# Patient Record
Sex: Male | Born: 1995 | Race: White | Hispanic: No | Marital: Single | Smoking: Never smoker
Health system: Southern US, Community
[De-identification: ages and names within clinical notes are randomized; demographics above are authoritative.]

## PROBLEM LIST (undated history)

## (undated) DIAGNOSIS — N200 Calculus of kidney: Secondary | ICD-10-CM

---
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
## 2014-02-13 HISTORY — PX: WISDOM TOOTH EXTRACTION: SHX21

## 2015-02-13 ENCOUNTER — Ambulatory Visit (INDEPENDENT_AMBULATORY_CARE_PROVIDER_SITE_OTHER): Payer: 59 | Admitting: Urgent Care

## 2015-02-13 VITALS — BP 136/84 | HR 115 | Temp 98.2°F | Resp 17 | Ht 71.5 in | Wt 202.0 lb

## 2015-02-13 DIAGNOSIS — R591 Generalized enlarged lymph nodes: Secondary | ICD-10-CM | POA: Diagnosis not present

## 2015-02-13 DIAGNOSIS — R070 Pain in throat: Secondary | ICD-10-CM | POA: Diagnosis not present

## 2015-02-13 DIAGNOSIS — J039 Acute tonsillitis, unspecified: Secondary | ICD-10-CM

## 2015-02-13 MED ORDER — AMOXICILLIN-POT CLAVULANATE 875-125 MG PO TABS: 1.0000 | ORAL_TABLET | Freq: Two times a day (BID) | ORAL | Status: DC

## 2015-02-13 MED ORDER — HYDROCODONE-HOMATROPINE 5-1.5 MG/5ML PO SYRP: 5.0000 mL | ORAL_SOLUTION | Freq: Every evening | ORAL | Status: DC | PRN

## 2015-02-13 NOTE — Progress Notes (Signed)
    MRN: [id number] DOB: 10/22/1995  Subjective:   Jared Page is a 19 y.o. male with pmh of Diabetes I presenting for chief complaint of Sore Throat and Flu Vaccine  Reports 1 week history of worsening sore throat of left side, difficulty swallowing and is unable to hydrate and eat well in the past 2 days. Has also had left sided neck pain, slight cough, sinus congestion, left ear pain. Also feels fatigued, worn down. Went to [facility] and was negative for strep on 12/23, 30/2016. Has tried numbing salt water gargles with minimal relief. He did have a day or two were he had improved but suddenly felt significant throat pain. Denies fever, chest pain, shob, wheezing, n/v, abdominal pain, sinus pain, ear drainage, eye pain, red eyes, tooth pain. Has had a history of 1-2 strep throat infections. Has also had mono.  Jared Page has a current medication list which includes the following prescription(s): insulin lispro. Also has No Known Allergies.  Jared Page  has a past medical history of Diabetes mellitus. Also  has no past surgical history on file.  Objective:   Vitals: BP 136/84 mmHg  Pulse 115  Temp(Src) 98.2 F (36.8 C) (Oral)  Resp 17  Ht 5' 11.5" (1.816 m)  Wt 202 lb (91.627 kg)  BMI 27.78 kg/m2  SpO2 98%  Physical Exam  Constitutional: He is oriented to person, place, and time. He appears well-developed and well-nourished.  HENT:  Head:    TM's intact bilaterally, no effusions or erythema. Nasal turbinates pink and moist. No sinus tenderness. Bilateral tonsillar erythema and edema but without exudates.  Eyes: Right eye exhibits no discharge. Left eye exhibits no discharge. No scleral icterus.  Neck: Normal range of motion. Neck supple.  Cardiovascular: Normal rate, regular rhythm and intact distal pulses.  Exam reveals no gallop and no friction rub.   No murmur heard. Pulmonary/Chest: No respiratory distress. He has no wheezes. He has no rales.  Lymphadenopathy:    He has cervical  adenopathy (left, anterior).  Neurological: He is alert and oriented to person, place, and time.  Skin: Skin is warm and dry. No rash noted. No erythema. No pallor.   Assessment and Plan :   1. Acute tonsillitis, unspecified etiology 2. Throat pain - Despite negative strep tests in other clinics, I will cover for infectious process. Counseled patient on differential including Mono and morbilliform rash. Hycodan for throat pain  3. Lymphadenopathy - Monitor, if no improvement consider US of left cervical lymph node.  Wallis BambergMario Lakaisha Danish, PA-C [facility] [phone] 02/13/2015 12:51 PM

## 2015-02-13 NOTE — Patient Instructions (Signed)

## 2015-02-15 ENCOUNTER — Emergency Department (HOSPITAL_COMMUNITY)
Admission: EM | Admit: 2015-02-15 | Discharge: 2015-02-15 | Disposition: A | Discharge status: Home / Self Care | Payer: 59 | Attending: Emergency Medicine | Admitting: Emergency Medicine

## 2015-02-15 ENCOUNTER — Encounter (HOSPITAL_COMMUNITY): Payer: Self-pay | Admitting: Emergency Medicine

## 2015-02-15 ENCOUNTER — Encounter: Payer: Self-pay | Admitting: Family Medicine

## 2015-02-15 ENCOUNTER — Ambulatory Visit (INDEPENDENT_AMBULATORY_CARE_PROVIDER_SITE_OTHER): Payer: 59 | Admitting: Family Medicine

## 2015-02-15 ENCOUNTER — Emergency Department (HOSPITAL_COMMUNITY): Payer: 59

## 2015-02-15 VITALS — BP 140/80 | HR 146 | Temp 98.6°F | Resp 16 | Ht 71.5 in | Wt 197.8 lb

## 2015-02-15 DIAGNOSIS — J039 Acute tonsillitis, unspecified: Secondary | ICD-10-CM

## 2015-02-15 DIAGNOSIS — E869 Volume depletion, unspecified: Secondary | ICD-10-CM | POA: Diagnosis not present

## 2015-02-15 DIAGNOSIS — R110 Nausea: Secondary | ICD-10-CM | POA: Insufficient documentation

## 2015-02-15 DIAGNOSIS — D72829 Elevated white blood cell count, unspecified: Secondary | ICD-10-CM

## 2015-02-15 DIAGNOSIS — R000 Tachycardia, unspecified: Secondary | ICD-10-CM | POA: Insufficient documentation

## 2015-02-15 DIAGNOSIS — E109 Type 1 diabetes mellitus without complications: Secondary | ICD-10-CM

## 2015-02-15 DIAGNOSIS — J36 Peritonsillar abscess: Secondary | ICD-10-CM | POA: Diagnosis not present

## 2015-02-15 DIAGNOSIS — E119 Type 2 diabetes mellitus without complications: Secondary | ICD-10-CM | POA: Insufficient documentation

## 2015-02-15 DIAGNOSIS — Z794 Long term (current) use of insulin: Secondary | ICD-10-CM | POA: Diagnosis not present

## 2015-02-15 DIAGNOSIS — J029 Acute pharyngitis, unspecified: Secondary | ICD-10-CM | POA: Diagnosis present

## 2015-02-15 DIAGNOSIS — R1310 Dysphagia, unspecified: Secondary | ICD-10-CM

## 2015-02-15 LAB — I-STAT CHEM 8, ED
BUN: 21 mg/dL — ABNORMAL HIGH (ref 6–20)
CHLORIDE: 103 mmol/L (ref 101–111)
CREATININE: 0.7 mg/dL (ref 0.61–1.24)
Calcium, Ion: 1.14 mmol/L (ref 1.12–1.23)
GLUCOSE: 70 mg/dL (ref 65–99)
HEMATOCRIT: 59 % — AB (ref 39.0–52.0)
Hemoglobin: 20.1 g/dL — ABNORMAL HIGH (ref 13.0–17.0)
POTASSIUM: 4.1 mmol/L (ref 3.5–5.1)
Sodium: 140 mmol/L (ref 135–145)
TCO2: 25 mmol/L (ref 0–100)

## 2015-02-15 LAB — POCT CBC
Granulocyte percent: 85.4 %G — AB (ref 37–80)
HEMATOCRIT: 47.9 % (ref 43.5–53.7)
Hemoglobin: 17 g/dL (ref 14.1–18.1)
LYMPH, POC: 1.9 (ref 0.6–3.4)
MCH, POC: 30.5 pg (ref 27–31.2)
MCHC: 35.4 g/dL (ref 31.8–35.4)
MCV: 86.3 fL (ref 80–97)
MID (cbc): 0.8 (ref 0–0.9)
MPV: 7.5 fL (ref 0–99.8)
POC GRANULOCYTE: 16.1 — AB (ref 2–6.9)
POC LYMPH %: 10.2 % (ref 10–50)
POC MID %: 4.4 % (ref 0–12)
Platelet Count, POC: 308 10*3/uL (ref 142–424)
RBC: 5.56 M/uL (ref 4.69–6.13)
RDW, POC: 12.5 %
WBC: 18.9 10*3/uL — AB (ref 4.6–10.2)

## 2015-02-15 LAB — POCT RAPID STREP A (OFFICE): RAPID STREP A SCREEN: NEGATIVE

## 2015-02-15 LAB — MONONUCLEOSIS SCREEN: MONO SCREEN: NEGATIVE

## 2015-02-15 LAB — GLUCOSE, POCT (MANUAL RESULT ENTRY): POC Glucose: 94 mg/dl (ref 70–99)

## 2015-02-15 MED ORDER — SODIUM CHLORIDE 0.9 % IV SOLN
1.0000 g | Freq: Once | INTRAVENOUS | Status: AC
  Administered 2015-02-15: 1 g via INTRAVENOUS
  Filled 2015-02-15: qty 1000

## 2015-02-15 MED ORDER — DEXAMETHASONE SODIUM PHOSPHATE 10 MG/ML IJ SOLN
10.0000 mg | Freq: Once | INTRAMUSCULAR | Status: AC
  Administered 2015-02-15: 10 mg via INTRAVENOUS
  Filled 2015-02-15: qty 1

## 2015-02-15 MED ORDER — CLINDAMYCIN HCL 300 MG PO CAPS: 300.0000 mg | ORAL_CAPSULE | Freq: Three times a day (TID) | ORAL | Status: DC

## 2015-02-15 MED ORDER — IBUPROFEN 600 MG PO TABS: 600.0000 mg | ORAL_TABLET | Freq: Three times a day (TID) | ORAL | Status: AC

## 2015-02-15 MED ORDER — DEXAMETHASONE 6 MG PO TABS: 12.0000 mg | ORAL_TABLET | Freq: Once | ORAL | Status: DC

## 2015-02-15 MED ORDER — MORPHINE SULFATE (PF) 4 MG/ML IV SOLN
6.0000 mg | Freq: Once | INTRAVENOUS | Status: AC
  Administered 2015-02-15: 6 mg via INTRAVENOUS
  Filled 2015-02-15: qty 2

## 2015-02-15 MED ORDER — KETOROLAC TROMETHAMINE 30 MG/ML IJ SOLN
30.0000 mg | Freq: Once | INTRAMUSCULAR | Status: AC
  Administered 2015-02-15: 30 mg via INTRAVENOUS
  Filled 2015-02-15: qty 1

## 2015-02-15 MED ORDER — SODIUM CHLORIDE 0.9 % IV BOLUS (SEPSIS)
2000.0000 mL | Freq: Once | INTRAVENOUS | Status: AC
  Administered 2015-02-15: 2000 mL via INTRAVENOUS

## 2015-02-15 MED ORDER — IOHEXOL 300 MG/ML  SOLN
75.0000 mL | Freq: Once | INTRAMUSCULAR | Status: AC | PRN
  Administered 2015-02-15: 75 mL via INTRAVENOUS

## 2015-02-15 NOTE — ED Notes (Signed)
Mother states pt was sent by PCP for eval of peritonsilar abscess, reports PCP states page ENT when he arrives. ,

## 2015-02-15 NOTE — ED Notes (Signed)
Pt from home for eval of sore throat x2 weeks, states has been seen by PCP and given antibiotics for possible strep but pain has gotten worse and pt unable to eat x3 days. Tenderness noted to left side of neck and severely enlarged tonsil noted to left tonsile. Pt in nad at this time axox 4.

## 2015-02-15 NOTE — Progress Notes (Addendum)
Subjective:  By signing my name below, I, Jared Page, attest that this documentation has been prepared under the direction and in the presence of Jared StaggersJeffrey Shakinah Navis, MD.  Jared Page, Medical Scribe. 02/15/2015.  11:17 AM. I personally performed the services described in this documentation, which was scribed in my presence. The recorded information has been reviewed and considered, and addended by me as needed.      Patient ID: Jared Rumpfylan H. Dunsmore, male    DOB: 02/02/1996, 20 y.o.   MRN: [id number]  Chief Complaint  Patient presents with  . Follow-up    tonsillitis    HPI HPI Comments: Jared Page is a 20 y.o. male who presents to [facility] for a follow up regarding his tonsillitis. Pt was seen 2 days ago with sore throat for 1 week with some increased difficulty swallowing. He was seen by Jared Page two times, with the last one was on 12/30, and had negative strep test on both time with possible strep culture, and he tested negative for mono on that last visit. Based on exam by Jared Page, he had BL tonsil erythema, edema, but no exudate, and enlarged left cervical lymph node. He was treated with Augmentin 875 BID, and hycodan cough syrup.  Pt states that his symptoms have been getting worse, he indicates that the swelling in tonsils has not been improving, and his trouble swallowing has increased. He reports that he has been drinking about 2-3 bottles of Gatorade per day, and notes that he urinated 2 times yesterday, and one time today, with dark urine color. Pt reports associated symptoms of lightheadedness, dizziness, nasal congestion, post nasal drip, and increased difficulty to open his jaw onset today. Pt does have a previous history of mono. He denies fevers.   Pt has a history of Type I diabetes, and indicates that it has been controlled. His blood sugar ranges between 100-150, with a max value of 199 a few days ago, which is possibly due to his diet. He is on  an Insulin pump     Pt attended [employer].   There are no active problems to display for this patient.  Past Medical History  Diagnosis Date  . Diabetes mellitus    No past surgical history on file. No Known Allergies Prior to Admission medications   Medication Sig Start Date End Date Taking? Authorizing Provider  amoxicillin-clavulanate (AUGMENTIN) 875-125 MG tablet Take 1 tablet by mouth 2 (two) times daily. 02/13/15  Yes Wallis BambergMario Mani, PA-C  HYDROcodone-homatropine [facility](HYCODAN) 5-1.5 MG/5ML syrup Take 5 mLs by mouth at bedtime as needed. 02/13/15  Yes Wallis BambergMario Mani, PA-C  Insulin Lispro, Human, (HUMALOG [place]) Inject into the skin continuous. humalog insulin pump   Yes Historical Provider, MD   Social History   Social History  . Marital Status: Single    Spouse Name: Jared Page  . Number of Children: Jared Page  . Years of Education: Jared Page   Occupational History  . Not on file.   Social History Main Topics  . Smoking status: Never Smoker   . Smokeless tobacco: Not on file  . Alcohol Use: No  . Drug Use: No  . Sexual Activity: No   Other Topics Concern  . Not on file   Social History Narrative    Review of Systems  Constitutional: Negative for fever.  HENT: Positive for congestion, postnasal drip and trouble swallowing.   Musculoskeletal: Positive for joint swelling.  Neurological: Positive for dizziness and  light-headedness.      Objective:   Physical Exam  Constitutional: He is oriented to person, place, and time. He appears well-developed and well-nourished. No distress.  HENT:  Head: Normocephalic and atraumatic.  Right Ear: Tympanic membrane, external ear and ear canal normal.  Left Ear: Tympanic membrane, external ear and ear canal normal.  Nose: No rhinorrhea.  Mouth/Throat: Oropharynx is clear and moist and mucous membranes are normal. No oropharyngeal exudate or posterior oropharyngeal erythema.  Pt had difficulty to open his jaw.  TM are pearly grey. Canals are  clear.   Eyes: Conjunctivae and EOM are normal. Pupils are equal, round, and reactive to light.  Neck: Neck supple.  Markedly edemous 2-3+ tonsil with increased in left vs. right, with slight uvular shift. No exudate.  Cardiovascular: Normal rate, regular rhythm, normal heart sounds and intact distal pulses.   No murmur heard. Pulmonary/Chest: Effort normal and breath sounds normal. He has no wheezes. He has no rhonchi. He has no rales.  Abdominal: Soft. There is no tenderness.  No apparent HSM.   Lymphadenopathy:       Head (right side): No preauricular and no occipital adenopathy present.       Head (left side): No preauricular and no occipital adenopathy present.    He has no cervical adenopathy.       Right: No supraclavicular and no epitrochlear adenopathy present.       Left: No supraclavicular and no epitrochlear adenopathy present.  Enlarged tender AC node, left greater than right. No other lymphadenopathy in the neck.   Neurological: He is alert and oriented to person, place, and time. No cranial nerve deficit.  Skin: Skin is warm and dry. No rash noted.  Psychiatric: He has a normal mood and affect. His behavior is normal.  Nursing note and vitals reviewed.   Filed Vitals:   02/15/15 1057 02/15/15 1059  BP: 151/101 138/98  Pulse: 117   Temp: 98.6 F (37 C)   TempSrc: Oral   Resp: 16   Height: 5' 11.5" (1.816 m)   Weight: 197 lb 12.8 oz (89.721 kg)   SpO2: 99%    Orthostatic VS for the past 24 hrs (Last 3 readings):  BP- Lying Pulse- Lying BP- Sitting Pulse- Sitting BP- Standing at 0 minutes Pulse- Standing at 0 minutes  02/15/15 1133 (!) 146/98 mmHg 102 (!) 152/94 mmHg 122 140/80 mmHg 146   Results for orders placed or performed in visit on 02/15/15  POCT CBC  Result Value Ref Range   WBC 18.9 (A) 4.6 - 10.2 K/uL   Lymph, poc 1.9 0.6 - 3.4   POC LYMPH PERCENT 10.2 10 - 50 %L   MID (cbc) 0.8 0 - 0.9   POC MID % 4.4 0 - 12 %M   POC Granulocyte 16.1 (A) 2 - 6.9    Granulocyte percent 85.4 (A) 37 - 80 %G   RBC 5.56 4.69 - 6.13 M/uL   Hemoglobin 17.0 14.1 - 18.1 g/dL   HCT, POC 16.1 09.6 - 53.7 %   MCV 86.3 80 - 97 fL   MCH, POC 30.5 27 - 31.2 pg   MCHC 35.4 31.8 - 35.4 g/dL   RDW, POC 04.5 %   Platelet Count, POC 308 142 - 424 K/uL   MPV 7.5 0 - 99.8 fL  POCT rapid strep A  Result Value Ref Range   Rapid Strep A Screen Negative Negative  POCT glucose (manual entry)  Result Value Ref Range  POC Glucose 94 70 - 99 mg/dl       Assessment & Plan:  Jared Page is a 20 y.o. male Acute tonsillitis, unspecified etiology - Plan: POCT CBC, POCT rapid strep A, Epstein-Barr virus VCA antibody panel, Culture, Group A Strep  Difficulty swallowing - Plan: Culture, Group A Strep  Volume depletion - Plan: Orthostatic vital signs  Type 1 diabetes mellitus without complication (HCC) - Plan: POCT glucose (manual entry)  Leukocytosis  tonsillitis with possible early peritonsilar abscess on left, s/p 4 doses of Augmentin with worsening and difficulty swallowing leading to volume depletion - orthostatic. S/p multiple negative rapid strep tests, and reportedly had normal throat cx and negative mono testing last week, but I do not have those results. Marked leukoctosis today concerning for bacterial cause and PTA vs tonsillar hypertrophy with EBV or CMV. [place] IDDM, but glycemic control has been doing ok during this illness.   -throat cx and EBV titer sent.   -discussed with ENT - will have evaluated by ER first with likely IVF/hydration,  then ER provider to call ENT for eval if needed.   -discussed with patient and parent in room - all questions answered.   -nurse first/triage advised at [facility].     No orders of the defined types were placed in this encounter.   Patient Instructions  You may have an early abscess around your tonsils, and will likely need Ear Nose and Throat to evaluate you. However, you also appear to be dehydrated, so will need some  IV fluid through the emergency room and then can be evaluated by Ear Nose and Throat there. Proceed to [facility] Emergency Room after leaving our office for evaluation.       I personally performed the services described in this documentation, which was scribed in my presence. The recorded information has been reviewed and considered, and addended by me as needed.

## 2015-02-15 NOTE — ED Notes (Signed)
MD aware of strep culture preformed at urgent care today.

## 2015-02-15 NOTE — ED Notes (Signed)
Pt tolerating PO liquids at this time

## 2015-02-15 NOTE — Patient Instructions (Addendum)
You may have an early abscess around your tonsils, and will likely need Ear Nose and Throat to evaluate you. However, you also appear to be dehydrated, so will need some IV fluid through the emergency room and then can be evaluated by Ear Nose and Throat there. Proceed to [facility] Room after leaving our office for evaluation.

## 2015-02-15 NOTE — ED Provider Notes (Signed)
CSN: [id number]     Arrival date & time 02/15/15  1233 History   First MD Initiated Contact with Patient 02/15/15 1453     Chief Complaint  Patient presents with  . Sore Throat     (Consider location/radiation/quality/duration/timing/severity/associated sxs/prior Treatment) Patient is a 20 y.o. male presenting with pharyngitis.  Sore Throat This is a new problem. The current episode started more than 1 week ago. The problem occurs constantly. The problem has been gradually worsening. Associated symptoms include headaches. Pertinent negatives include no abdominal pain and no shortness of breath. Nothing aggravates the symptoms. Nothing relieves the symptoms. He has tried nothing for the symptoms. The treatment provided no relief.   20 yo M w/ progressively worsening sore throat over last two weeks but abrupt worsening over last few days. Been checked repeatedly for mono and strep but were negative. Over last day has had increased difficulty in swallowing, spitting more, not handling secretions. No fevers, but decreased PO 2/2 difficulty swallowing. Has DM, no other PMH, no h/o same.   Past Medical History  Diagnosis Date  . Diabetes mellitus    History reviewed. No pertinent past surgical history. Family History  Problem Relation Age of Onset  . Diabetes Mother   . Heart disease Mother   . Hyperlipidemia Mother   . Diabetes Brother   . Stroke Maternal Grandfather    Social History  Substance Use Topics  . Smoking status: Never Smoker   . Smokeless tobacco: None  . Alcohol Use: No    Review of Systems  Constitutional: Negative for fever and chills.  HENT: Positive for facial swelling and sore throat. Negative for rhinorrhea.   Respiratory: Positive for cough. Negative for shortness of breath.   Gastrointestinal: Positive for nausea. Negative for abdominal pain.  Neurological: Positive for headaches.  All other systems reviewed and are negative.     Allergies  Review of  patient's allergies indicates no known allergies.  Home Medications   Prior to Admission medications   Medication Sig Start Date End Date Taking? Authorizing Provider  cetirizine (ZYRTEC) 10 MG chewable tablet Chew 10 mg by mouth daily as needed for allergies.   Yes Historical Provider, MD  HYDROcodone-homatropine (HYCODAN) 5-1.5 MG/5ML syrup Take 5 mLs by mouth at bedtime as needed. 02/13/15  Yes Wallis Bamberg, PA-C  Insulin Lispro, Human, (HUMALOG [place]) Inject 150 Units into the skin See admin instructions. Up to 150 units daily per humalog insulin pump   Yes Historical Provider, MD  lidocaine (XYLOCAINE) 2 % solution Use as directed 20 mLs in the mouth or throat every 6 (six) hours as needed for mouth pain.   Yes Historical Provider, MD  naproxen sodium (ANAPROX) 220 MG tablet Take 220 mg by mouth 2 (two) times daily as needed (pain).   Yes Historical Provider, MD  pseudoephedrine (SUDAFED) 30 MG tablet Take 30 mg by mouth every 4 (four) hours as needed for congestion.   Yes Historical Provider, MD  clindamycin (CLEOCIN) 300 MG capsule Take 1 capsule (300 mg total) by mouth 3 (three) times daily. X 7 days 02/15/15   Marily Memos, MD  dexamethasone (DECADRON) 6 MG tablet Take 2 tablets (12 mg total) by mouth once. 02/17/15   Marily Memos, MD  ibuprofen (ADVIL,MOTRIN) 600 MG tablet Take 1 tablet (600 mg total) by mouth 3 (three) times daily. 02/15/15   Lewi Drost, MD   BP 150/95 mmHg  Pulse 118  Temp(Src) 98.6 F (37 C) (Oral)  Resp 20  Ht 5\' 11"  (1.803 m)  Wt 197 lb (89.359 kg)  BMI 27.49 kg/m2  SpO2 97% Physical Exam  Constitutional: He is oriented to person, place, and time. He appears well-developed and well-nourished.  HENT:  Head: Normocephalic and atraumatic.  Mouth/Throat: No uvula swelling. Posterior oropharyngeal edema and posterior oropharyngeal erythema present.  L>R peritonsillar swelling. No obvious abscess.  Neck: Normal range of motion.  Cardiovascular: Tachycardia present.    Pulmonary/Chest: Effort normal. No respiratory distress.  Abdominal: Soft. He exhibits no distension. There is no tenderness.  Musculoskeletal: Normal range of motion. He exhibits no edema or tenderness.  Lymphadenopathy:    He has cervical adenopathy.  Neurological: He is alert and oriented to person, place, and time. No cranial nerve deficit.  Skin: Skin is warm and dry.  Nursing note and vitals reviewed.   ED Course  Procedures (including critical care time) Labs Review Labs Reviewed  I-STAT CHEM 8, ED - Abnormal; Notable for the following:    BUN 21 (*)    Hemoglobin 20.1 (*)    HCT 59.0 (*)    All other components within normal limits  RAPID STREP SCREEN (NOT AT [facility])  MONONUCLEOSIS SCREEN    Imaging Review Ct Soft Tissue Neck W Contrast  02/15/2015  CLINICAL DATA:  Left peritonsillar pain and swelling for 2 weeks with difficulty swallowing and eating. History of diabetes. EXAM: CT NECK WITH CONTRAST TECHNIQUE: Multidetector CT imaging of the neck was performed using the standard protocol following the bolus administration of intravenous contrast. CONTRAST:  75mL OMNIPAQUE IOHEXOL 300 MG/ML  SOLN COMPARISON:  None. FINDINGS: There is abnormal swelling of both tonsillar pillars, left greater than right, with an 8 mm hypodensity in the low left tonsil compatible with confluent phlegmon or incipient abscess. This is shown on image 36 series 2. Thickening of the soft palate is present in this collapse the nasopharyngeal airway. Adenoidal tissue upper normal for age. Tonsillar hypertrophy at the tongue base partially filling the vallecula bilaterally. There is some mild thickening of the left side of the epiglottis and of the left aryepiglottic fold with effacement of the left piriform sinus. This thickening is confluent with the left tonsillar swelling. Bilateral internal jugular chain adenopathy including a 2.1 cm left station 2 lymph node and a 1.9 cm right station 2 lymph node.  Scattered additional enlarged lymph lymph nodes in the neck are present. No visible extension of the inflammatory process below the vertical level of the glottis. There is chronic right maxillary sinusitis. Lung apices and upper mediastinum unremarkable. No significant dental decay. Thyroid gland normal. IMPRESSION: 1. Prominent enlargement of the tonsillar pillars with thickening of the soft palate. 8 mm hypodensity in the asymmetrically enlarged left tonsillar pillar likely represents phlegmon or incipient abscess. The left tonsillar enlargement is confluent with mild abnormal thickening of the left side of the epiglottis and left aryepiglottic fold, with effacement of the left piriform sinus. This would be a very atypical appearance for epiglottitis and more likely is due to the localized tonsillar inflammation extending caudad, but no overt inflammation extends below the level of the glottis. Prominence of the lingual tonsils which partially fill the vallecula. 2. Bilateral internal jugular adenopathy, likely reactive. Representative left station 2 lymph node 2.1 cm in short axis. 3. The nasopharyngeal airway is coapted by the soft tissue swelling, but the or pharyngeal airway is patent. Electronically Signed   By: Gaylyn RongWalter  Liebkemann M.D.   On: 02/15/2015 16:43   I have personally reviewed  and evaluated these images and lab results as part of my medical decision-making.   EKG Interpretation None      MDM   Final diagnoses:  Peritonsillar abscess   Concern for L PTA v necrotizing tonsillitis. Has been on amoxicillin lately, increased difficulty handling secretions, difficulty swallowing. Will start with decadron, toradol, morphine, fluids, ampicillin while awaiting CT scan. Will disposition based on clinical course.   Patient with. Drainage from his left tonsil prior to CT scan. CT scan still shows some likely retaining fluid. On reevaluation patient tolerating by mouth with no respiratory  distress. I discussed case with ENT who said that the patient go home and follow up with them in the morning as well as his airway was clear and he was handling his secretions and swallowing okay. On reevaluation patient drank 2 glasses of water and improved heart rate and overall improved pain. Stable for discharge and follow-up tomorrow.    Marily Memos, MD 02/16/15 0030

## 2015-02-16 LAB — EPSTEIN-BARR VIRUS VCA ANTIBODY PANEL
EBV EA IgG: <5 U/mL (ref <9.0)
EBV NA IgG: 235 U/mL — ABNORMAL HIGH (ref <18.0)
EBV VCA IgG: 48.5 U/mL — ABNORMAL HIGH (ref <18.0)
EBV VCA IgM: 63.3 U/mL — ABNORMAL HIGH (ref <36.0)

## 2015-02-17 LAB — CULTURE, GROUP A STREP: ORGANISM ID, BACTERIA: NORMAL

## 2015-04-01 ENCOUNTER — Ambulatory Visit (INDEPENDENT_AMBULATORY_CARE_PROVIDER_SITE_OTHER): Payer: 59 | Admitting: Family Medicine

## 2015-04-01 VITALS — BP 116/80 | HR 89 | Temp 98.3°F | Resp 19 | Ht 71.25 in | Wt 210.6 lb

## 2015-04-01 DIAGNOSIS — J309 Allergic rhinitis, unspecified: Secondary | ICD-10-CM | POA: Diagnosis not present

## 2015-04-01 DIAGNOSIS — J069 Acute upper respiratory infection, unspecified: Secondary | ICD-10-CM | POA: Diagnosis not present

## 2015-04-01 DIAGNOSIS — J029 Acute pharyngitis, unspecified: Secondary | ICD-10-CM

## 2015-04-01 LAB — POCT RAPID STREP A (OFFICE): RAPID STREP A SCREEN: NEGATIVE

## 2015-04-01 MED ORDER — FLUTICASONE PROPIONATE 50 MCG/ACT NA SUSP: 2.0000 | Freq: Every day | NASAL | Status: DC

## 2015-04-01 MED ORDER — AMOXICILLIN-POT CLAVULANATE 875-125 MG PO TABS: 1.0000 | ORAL_TABLET | Freq: Two times a day (BID) | ORAL | Status: DC

## 2015-04-01 NOTE — Patient Instructions (Addendum)
Please use flonase, 2 sprays in each nostril nightly Take Zyrtec daily If not improved in 2-3 days, start augmentin antibiotic Drink enough fluids so your urine is light yellow Can take ibuprofen 600 mg every 8 to 12 hours as needed for pain Please come back in if you have further tonsilar swelling, develop fever over 101, wheezing or SOB

## 2015-04-01 NOTE — Progress Notes (Signed)
HPI This is a pleasant 20 yo male who is accompanied by his mother. He  comes in today with nasal congestion, cough, and sore throat x 6 days. States that he has some pain with swallowing and itching in his throat. Has cough that is mostly dry but occasionally has small amount thick yellow-green sputum. Denies wheezing, SOB, or chest pain. States he uses cough drops with little relief. Today is his best day except for the throat soreness. He was diagnosed with mono at the end of 12/16 and is concerned he might have it again due to sore throat. He is not fatigued, is attending his college classes without difficulty. He is not using any OTC other than cough drops. According to the patient, he can not take acetaminophen because it gives false high readings on his meter. Can take ibuprofen. He has not been taking his zyrtec daily.   He has DM type 1 and uses insulin pump. Blood sugars in 200s yesterday x 1, have otherwise been normal for him.   Past Medical History  Diagnosis Date  . Diabetes mellitus    History reviewed. No pertinent past surgical history. Family History  Problem Relation Age of Onset  . Diabetes Mother   . Heart disease Mother   . Hyperlipidemia Mother   . Diabetes Brother   . Stroke Maternal Grandfather    Social History  Substance Use Topics  . Smoking status: Never Smoker   . Smokeless tobacco: None  . Alcohol Use: No   ROS Per HPI   Physical Exam General: well developed, NAD HEENT: bilateral tonsils +2, mild tonsilar erythema, nostrils inflamed with clear mucus bilaterally, no lymphadenopathy. Bilateral canals and TMs normal Cardiac: RRR, no murmurs or S3 Pulmonary: clear bilaterally Skin: Warm and dry Neuro: alert and oriented  Results for orders placed or performed in visit on 04/01/15  Culture, Group A Strep  Result Value Ref Range   Organism ID, Bacteria Normal Upper Respiratory Flora    Organism ID, Bacteria No Beta Hemolytic Streptococci Isolated    POCT rapid strep A  Result Value Ref Range   Rapid Strep A Screen Negative Negative        Assessment and Plan 1. Acute pharyngitis, unspecified etiology - POCT rapid strep A -sent for culture  2. Allergic rhinitis, unspecified allergic rhinitis type -take Zyrtec daily -Can take ibuprofen 600 mg every 8 to 12 hours as needed for pain -drink plenty of water - fluticasone (FLONASE) 50 MCG/ACT nasal spray; Place 2 sprays into both nostrils daily.  Dispense: 16 g; Refill: 6  3. Acute upper respiratory infection -If not improved in 2-3 days, start augmentin antibiotic - amoxicillin-clavulanate (AUGMENTIN) 875-125 MG tablet; Take 1 tablet by mouth 2 (two) times daily.  Dispense: 14 tablet; Refill: 0  Please come back in if you have further tonsilar swelling, develop fever over 101, wheezing or SOB  Olean Ree, FNP-BC  [facility], [facility]  04/01/2015 12:01 PM

## 2015-04-03 LAB — CULTURE, GROUP A STREP: Organism ID, Bacteria: NORMAL

## 2015-08-04 ENCOUNTER — Telehealth: Payer: Self-pay | Admitting: Radiology

## 2015-08-04 ENCOUNTER — Ambulatory Visit (INDEPENDENT_AMBULATORY_CARE_PROVIDER_SITE_OTHER): Payer: 59 | Admitting: Family Medicine

## 2015-08-04 ENCOUNTER — Ambulatory Visit (HOSPITAL_BASED_OUTPATIENT_CLINIC_OR_DEPARTMENT_OTHER)
Admission: RE | Admit: 2015-08-04 | Discharge: 2015-08-04 | Disposition: A | Discharge status: Home / Self Care | Payer: 59 | Source: Ambulatory Visit | Attending: Family Medicine | Admitting: Family Medicine

## 2015-08-04 VITALS — BP 118/72 | HR 96 | Temp 98.1°F | Resp 16 | Ht 71.0 in | Wt 212.0 lb

## 2015-08-04 DIAGNOSIS — N200 Calculus of kidney: Secondary | ICD-10-CM | POA: Diagnosis not present

## 2015-08-04 DIAGNOSIS — E109 Type 1 diabetes mellitus without complications: Secondary | ICD-10-CM | POA: Diagnosis not present

## 2015-08-04 DIAGNOSIS — Z87442 Personal history of urinary calculi: Secondary | ICD-10-CM

## 2015-08-04 DIAGNOSIS — R310 Gross hematuria: Secondary | ICD-10-CM | POA: Diagnosis present

## 2015-08-04 LAB — POCT CBC
Granulocyte percent: 62.7 %G (ref 37–80)
HCT, POC: 46.7 % (ref 43.5–53.7)
HEMOGLOBIN: 16.8 g/dL (ref 14.1–18.1)
Lymph, poc: 1.9 (ref 0.6–3.4)
MCH: 31.1 pg (ref 27–31.2)
MCHC: 35.9 g/dL — AB (ref 31.8–35.4)
MCV: 86.6 fL (ref 80–97)
MID (cbc): 0.7 (ref 0–0.9)
MPV: 7.7 fL (ref 0–99.8)
POC Granulocyte: 4.4 (ref 2–6.9)
POC LYMPH PERCENT: 27.8 %L (ref 10–50)
POC MID %: 9.5 % (ref 0–12)
Platelet Count, POC: 234 10*3/uL (ref 142–424)
RBC: 5.39 M/uL (ref 4.69–6.13)
RDW, POC: 13.2 %
WBC: 7 10*3/uL (ref 4.6–10.2)

## 2015-08-04 LAB — COMPREHENSIVE METABOLIC PANEL
ALT: 54 U/L — ABNORMAL HIGH (ref 9–46)
AST: 78 U/L — AB (ref 10–40)
Albumin: 4.7 g/dL (ref 3.6–5.1)
Alkaline Phosphatase: 134 U/L — ABNORMAL HIGH (ref 40–115)
BILIRUBIN TOTAL: 0.6 mg/dL (ref 0.2–1.2)
BUN: 11 mg/dL (ref 7–25)
CALCIUM: 10 mg/dL (ref 8.6–10.3)
CO2: 29 mmol/L (ref 20–31)
Chloride: 100 mmol/L (ref 98–110)
Creat: 0.9 mg/dL (ref 0.60–1.35)
GLUCOSE: 136 mg/dL — AB (ref 65–99)
POTASSIUM: 4.5 mmol/L (ref 3.5–5.3)
Sodium: 139 mmol/L (ref 135–146)
Total Protein: 7.6 g/dL (ref 6.1–8.1)

## 2015-08-04 LAB — POCT URINALYSIS DIP (MANUAL ENTRY)
BILIRUBIN UA: NEGATIVE
Glucose, UA: NEGATIVE
Ketones, POC UA: NEGATIVE
Leukocytes, UA: NEGATIVE
NITRITE UA: NEGATIVE
PH UA: 6
PROTEIN UA: NEGATIVE
Spec Grav, UA: >=1.03
UROBILINOGEN UA: 0.2

## 2015-08-04 LAB — POC MICROSCOPIC URINALYSIS (UMFC): Mucus: ABSENT

## 2015-08-04 LAB — GLUCOSE, POCT (MANUAL RESULT ENTRY): POC Glucose: 137 mg/dl — AB (ref 70–99)

## 2015-08-04 MED ORDER — TAMSULOSIN HCL 0.4 MG PO CAPS
0.4000 mg | ORAL_CAPSULE | Freq: Every day | ORAL | Status: DC
Start: 2015-08-04 — End: 2015-10-28

## 2015-08-04 MED ORDER — DOXYCYCLINE HYCLATE 100 MG PO CAPS: 100.0000 mg | ORAL_CAPSULE | Freq: Two times a day (BID) | ORAL | Status: DC

## 2015-08-04 NOTE — Telephone Encounter (Signed)
Patients CT scan has been approved for him prior notification is [id number]

## 2015-08-04 NOTE — Telephone Encounter (Signed)
I did discuss with patient/ he understands and is aware of results / will increase water

## 2015-08-04 NOTE — Patient Instructions (Addendum)
Go now for your CT Scan your insurance has approved it Berkley Harvey [phone] You will register in Radiology for the scan, they will call report to [place] while you are there  Driving directions to [facility] [id number]  - more info    [address]  [place], [place] [address]     1. Head south on [address] toward [address]      0.7 mi    2. Turn left onto [address]      0.4 mi    3. Take the 3rd right onto [facility] [address]      1.1 mi    4. Take the [address] ramp to [facility]      0.2 mi    5. Merge onto [address]      3.7 mi    6. Take [address] [place]      0.3 mi    7. Keep left at the fork, follow signs for [address]      381 ft    8. Keep left at the fork, follow signs for [facility] [address]      302 ft    9. Turn left onto [address]      2.6 mi    10. Turn right onto [address] will be on the left     0.2 mi     [place] [employer]        IF you received an x-ray today, you will receive an invoice from [facility]. Please contact [facility] at [phone] with questions or concerns regarding your invoice.   IF you received labwork today, you will receive an invoice from [employer]. Please contact [facility] at [phone] with questions or concerns regarding your invoice.   Our billing staff will not be able to assist you with questions regarding bills from these companies.  You will be contacted with the lab results as soon as they are available. The fastest way to get your results is to activate your My Chart account. Instructions are located on the last page of this paperwork. If you have not heard from [place] regarding the results in 2 weeks, please contact this office.    Hematuria, Adult Hematuria is blood in your urine. It can be caused by a bladder infection, kidney infection, prostate infection, kidney stone, or cancer of  your urinary tract. Infections can usually be treated with medicine, and a kidney stone usually will pass through your urine. If neither of these is the cause of your hematuria, further workup to find out the reason may be needed. It is very important that you tell your health care provider about any blood you see in your urine, even if the blood stops without treatment or happens without causing pain. Blood in your urine that happens and then stops and then happens again can be a symptom of a very serious condition. Also, pain is not a symptom in the initial stages of many urinary cancers. HOME CARE INSTRUCTIONS   Drink lots of fluid, 3-4 quarts a day. If you have been diagnosed with an infection, cranberry juice is especially recommended, in addition to large amounts of water.  Avoid caffeine, tea, and carbonated beverages because they tend to irritate the bladder.  Avoid alcohol because it may irritate the prostate.  Take all medicines as directed by your health care provider.  If you were prescribed an antibiotic medicine, finish it all even if you start to feel better.  If you have been diagnosed with a kidney stone, follow your health care provider's instructions regarding straining your urine to catch the stone.  Empty your bladder often. Avoid holding urine for long periods of time.  After a bowel movement, women should cleanse front to back. Use each tissue only once.  Empty your bladder before and after sexual intercourse if you are a male. SEEK MEDICAL CARE IF:  You develop back pain.  You have a fever.  You have a feeling of sickness in your stomach (nausea) or vomiting.  Your symptoms are not better in 3 days. Return sooner if you are getting worse. SEEK IMMEDIATE MEDICAL CARE IF:   You develop severe vomiting and are unable to keep the medicine down.  You develop severe back or abdominal pain despite taking your medicines.  You begin passing a large amount of blood  or clots in your urine.  You feel extremely weak or faint, or you pass out. MAKE SURE YOU:   Understand these instructions.  Will watch your condition.  Will get help right away if you are not doing well or get worse.   This information is not intended to replace advice given to you by your health care provider. Make sure you discuss any questions you have with your health care provider.   Document Released: 01/30/2005 Document Revised: 02/20/2014 Document Reviewed: 09/30/2012 Elsevier Interactive Patient Education [employer]2016 [employer]

## 2015-08-04 NOTE — Progress Notes (Signed)
Subjective:    Patient ID: Jared Page, male    DOB: 09/30/1995, 20 y.o.   MRN: [id number]  08/04/2015  Hematuria   HPI This 20 y.o. male presents for evaluation of hematuria.  Onset yesterday. Onset yesterday morning; some burning with urination.  Significant blood.  No fever/chills/sweats. No back pain.  +nausea brief each day; no vomiting.  No diarrhea.  No abdominal pain.  No similar symptoms.  No frequency.  +nocturia x 1 last night; baseline is x 1.  Sugar high last night in 200s.  Insulin pump.  Sugar this morning 157.  No penile discharge.  Sexually active; females; no STDs.  No testicular pain or swelling.  +history of kidney stones; this feels very different.  Had extreme pain with kidney stone in past.  Had some abdomianl pain; felt like had bowel movement  PCP: none; previous Samuel BoucheLucas.  [facility]; HgbA1c 7.5.   Transitioning; [facility]     Review of Systems  Constitutional: Negative for fever, chills, diaphoresis, activity change, appetite change and fatigue.  HENT: Negative for nosebleeds.   Respiratory: Negative for cough and shortness of breath.   Cardiovascular: Negative for chest pain, palpitations and leg swelling.  Gastrointestinal: Positive for nausea. Negative for vomiting, abdominal pain, diarrhea, constipation, blood in stool, abdominal distention and anal bleeding.  Endocrine: Negative for cold intolerance, heat intolerance, polydipsia, polyphagia and polyuria.  Genitourinary: Positive for dysuria and hematuria. Negative for urgency, frequency, flank pain, decreased urine volume, discharge, penile swelling, scrotal swelling, difficulty urinating, genital sores, penile pain and testicular pain.  Skin: Negative for color change, rash and wound.  Neurological: Negative for dizziness, tremors, seizures, syncope, facial asymmetry, speech difficulty, weakness, light-headedness, numbness and headaches.    Hematological: Does not bruise/bleed easily.  Psychiatric/Behavioral: Negative for sleep disturbance and dysphoric mood. The patient is not nervous/anxious.     Past Medical History  Diagnosis Date  . Diabetes mellitus    History reviewed. No pertinent past surgical history. No Known Allergies  Social History   Social History  . Marital Status: Single    Spouse Name: N/A  . Number of Children: N/A  . Years of Education: N/A   Occupational History  . Not on file.   Social History Main Topics  . Smoking status: Never Smoker   . Smokeless tobacco: Never Used  . Alcohol Use: No  . Drug Use: No  . Sexual Activity: No   Other Topics Concern  . Not on file   Social History Narrative   Family History  Problem Relation Age of Onset  . Diabetes Mother   . Heart disease Mother   . Hyperlipidemia Mother   . Diabetes Brother   . Stroke Maternal Grandfather        Objective:    BP 118/72 mmHg  Pulse 96  Temp(Src) 98.1 F (36.7 C) (Oral)  Resp 16  Ht 5\' 11"  (1.803 m)  Wt 212 lb (96.163 kg)  BMI 29.58 kg/m2  SpO2 97% Physical Exam  Constitutional: He is oriented to person, place, and time. He appears well-developed and well-nourished. No distress.  HENT:  Head: Normocephalic and atraumatic.  Right Ear: External ear normal.  Left Ear: External ear normal.  Nose: Nose normal.  Mouth/Throat: Oropharynx is clear and moist.  Eyes: Conjunctivae and EOM are normal. Pupils are equal, round, and reactive to light.  Neck: Normal range of motion. Neck supple. Carotid bruit is not present. No  thyromegaly present.  Cardiovascular: Normal rate, regular rhythm, normal heart sounds and intact distal pulses.  Exam reveals no gallop and no friction rub.   No murmur heard. Pulmonary/Chest: Effort normal and breath sounds normal. He has no wheezes. He has no rales.  Abdominal: Soft. Bowel sounds are normal. He exhibits no distension and no mass. There is no tenderness. There is no  rebound and no guarding.  Genitourinary: Testes normal and penis normal. Right testis shows no mass, no swelling and no tenderness. Left testis shows no mass, no swelling and no tenderness. Circumcised. No penile tenderness.  Lymphadenopathy:    He has no cervical adenopathy.  Neurological: He is alert and oriented to person, place, and time. No cranial nerve deficit.  Skin: Skin is warm and dry. No rash noted. He is not diaphoretic.  Psychiatric: He has a normal mood and affect. His behavior is normal.  Nursing note and vitals reviewed.  Results for orders placed or performed in visit on 08/04/15  Urine culture  Result Value Ref Range   Colony Count 4,000 COLONIES/ML    Organism ID, Bacteria Insignificant Growth   GC/Chlamydia Probe Amp  Result Value Ref Range   CT Probe RNA NOT DETECTED    GC Probe RNA NOT DETECTED   Comprehensive metabolic panel  Result Value Ref Range   Sodium 139 135 - 146 mmol/L   Potassium 4.5 3.5 - 5.3 mmol/L   Chloride 100 98 - 110 mmol/L   CO2 29 20 - 31 mmol/L   Glucose, Bld 136 (H) 65 - 99 mg/dL   BUN 11 7 - 25 mg/dL   Creat 1.61 0.96 - 0.45 mg/dL   Total Bilirubin 0.6 0.2 - 1.2 mg/dL   Alkaline Phosphatase 134 (H) 40 - 115 U/L   AST 78 (H) 10 - 40 U/L   ALT 54 (H) 9 - 46 U/L   Total Protein 7.6 6.1 - 8.1 g/dL   Albumin 4.7 3.6 - 5.1 g/dL   Calcium 40.9 8.6 - 81.1 mg/dL  POCT urinalysis dipstick  Result Value Ref Range   Color, UA yellow yellow   Clarity, UA clear clear   Glucose, UA negative negative   Bilirubin, UA negative negative   Ketones, POC UA negative negative   Spec Grav, UA >=1.030    Blood, UA large (A) negative   pH, UA 6.0    Protein Ur, POC negative negative   Urobilinogen, UA 0.2    Nitrite, UA Negative Negative   Leukocytes, UA Negative Negative  POCT Microscopic Urinalysis ([facility])  Result Value Ref Range   WBC,UR,HPF,POC None None WBC/hpf   RBC,UR,HPF,POC Too numerous to count  (A) None RBC/hpf   Bacteria None None,  Too numerous to count   Mucus Absent Absent   Epithelial Cells, UR Per Microscopy None None, Too numerous to count cells/hpf  POCT CBC  Result Value Ref Range   WBC 7.0 4.6 - 10.2 K/uL   Lymph, poc 1.9 0.6 - 3.4   POC LYMPH PERCENT 27.8 10 - 50 %L   MID (cbc) 0.7 0 - 0.9   POC MID % 9.5 0 - 12 %M   POC Granulocyte 4.4 2 - 6.9   Granulocyte percent 62.7 37 - 80 %G   RBC 5.39 4.69 - 6.13 M/uL   Hemoglobin 16.8 14.1 - 18.1 g/dL   HCT, POC 91.4 78.2 - 53.7 %   MCV 86.6 80 - 97 fL   MCH, POC 31.1 27 - 31.2 pg  MCHC 35.9 (A) 31.8 - 35.4 g/dL   RDW, POC 16.1 %   Platelet Count, POC 234 142 - 424 K/uL   MPV 7.7 0 - 99.8 fL  POCT glucose (manual entry)  Result Value Ref Range   POC Glucose 137 (A) 70 - 99 mg/dl       Assessment & Plan:   1. Hematuria, gross   2. Type 1 diabetes mellitus without complication (HCC)   3. History of nephrolithiasis     Orders Placed This Encounter  Procedures  . Urine culture  . GC/Chlamydia Probe Amp  . CT RENAL STONE STUDY    HOLD & CALL [phone]    Standing Status: Future     Number of Occurrences: 1     Standing Expiration Date: 11/03/2016    Order Specific Question:  Reason for Exam (SYMPTOM  OR DIAGNOSIS REQUIRED)    Answer:  gross hematuria    Order Specific Question:  Preferred imaging location?    Answer:  [facility] Specific Question:  Call Results- Best Contact Number?    Answer:  [phone]  . Comprehensive metabolic panel  . Ambulatory referral to Urology    Referral Priority:  Routine    Referral Type:  Consultation    Referral Reason:  Specialty Services Required    Requested Specialty:  Urology    Number of Visits Requested:  1  . POCT urinalysis dipstick  . POCT Microscopic Urinalysis ([facility])  . POCT CBC  . POCT glucose (manual entry)   Meds ordered this encounter  Medications  . doxycycline (VIBRAMYCIN) 100 MG capsule    Sig: Take 1 capsule (100 mg total) by mouth 2 (two) times daily.     Dispense:  20 capsule    Refill:  0  . tamsulosin (FLOMAX) 0.4 MG CAPS capsule    Sig: Take 1 capsule (0.4 mg total) by mouth daily.    Dispense:  30 capsule    Refill:  0    No Follow-up on file.    Kristi Paulita Fujita, M.D. [facility] [address] [place], [place]  [address] [phone] phone [phone] fax

## 2015-08-04 NOTE — Telephone Encounter (Signed)
8 mm nonobstructing calculus centrally in the right renal collecting system. I pulled the report/ no call yet There may still be phone problems

## 2015-08-04 NOTE — Telephone Encounter (Signed)
Tried to call patient to advise of report/ Dr Katrinka BlazingSmith states make sure he hydrates, non obstructing stone present, she will send in some Flomax for him. No answer on his cell, his VM is full. Will you try to reach him tomorrow

## 2015-08-05 LAB — GC/CHLAMYDIA PROBE AMP
CT PROBE, AMP APTIMA: NOT DETECTED
GC Probe RNA: NOT DETECTED

## 2015-08-05 LAB — URINE CULTURE

## 2015-08-30 ENCOUNTER — Other Ambulatory Visit: Payer: Self-pay | Admitting: Family Medicine

## 2015-08-30 DIAGNOSIS — R945 Abnormal results of liver function studies: Principal | ICD-10-CM

## 2015-08-30 DIAGNOSIS — R7989 Other specified abnormal findings of blood chemistry: Secondary | ICD-10-CM

## 2015-09-01 ENCOUNTER — Encounter: Payer: Self-pay | Admitting: *Deleted

## 2015-10-28 ENCOUNTER — Ambulatory Visit (INDEPENDENT_AMBULATORY_CARE_PROVIDER_SITE_OTHER): Payer: 59 | Admitting: Student

## 2015-10-28 ENCOUNTER — Encounter: Payer: Self-pay | Admitting: Student

## 2015-10-28 VITALS — BP 122/84 | Ht 71.0 in | Wt 200.0 lb

## 2015-10-28 DIAGNOSIS — R109 Unspecified abdominal pain: Secondary | ICD-10-CM

## 2015-10-28 DIAGNOSIS — R310 Gross hematuria: Secondary | ICD-10-CM

## 2015-10-28 DIAGNOSIS — N200 Calculus of kidney: Secondary | ICD-10-CM

## 2015-10-28 LAB — POCT URINALYSIS DIP (MANUAL ENTRY)
Bilirubin, UA: NEGATIVE
Ketones, POC UA: NEGATIVE
LEUKOCYTES UA: NEGATIVE
Nitrite, UA: NEGATIVE
Protein Ur, POC: NEGATIVE
SPEC GRAV UA: 1.025
UROBILINOGEN UA: 0.2
pH, UA: 7

## 2015-10-28 LAB — BASIC METABOLIC PANEL
BUN: 11 mg/dL (ref 7–25)
CALCIUM: 9.6 mg/dL (ref 8.6–10.3)
CO2: 28 mmol/L (ref 20–31)
CREATININE: 0.85 mg/dL (ref 0.60–1.35)
Chloride: 103 mmol/L (ref 98–110)
Glucose, Bld: 179 mg/dL — ABNORMAL HIGH (ref 65–99)
Potassium: 4.6 mmol/L (ref 3.5–5.3)
Sodium: 137 mmol/L (ref 135–146)

## 2015-10-28 LAB — POCT UA - MICROSCOPIC ONLY

## 2015-10-28 MED ORDER — MELOXICAM 15 MG PO TABS
15.0000 mg | ORAL_TABLET | Freq: Every day | ORAL | 2 refills | Status: AC
Start: 2015-10-28 — End: ?

## 2015-10-28 NOTE — Assessment & Plan Note (Addendum)
Likely secondary to nephrolithiasis as he does have a history of this. Will get a UA to check for blood and BMP to check his kidneys.  Asked patient to strain his urine and bring in any stones that he passes. Gave Mobic as needed for pain.  Did refer to urology as he does have a history of these recurring. Ordered a CT abdomen and pelvis to check to see if he has a stone and/or obstruction. Gave red flag signs for returning to care such as severe pain, fevers, chills.  Recommended increasing fluid intake, which he is doing.  Will do flomax if he has pain.

## 2015-10-28 NOTE — Progress Notes (Addendum)
  Jared Page - 20 y.o. male MRN [id number]  Date of birth: 10/05/1995  SUBJECTIVE:  Including CC & ROS.  CC: hematuria Presents with a week and a half history of gross hematuria. Denies any blood clots. He does have a history of nephrolithiasis. He was not having any flank pain and told recently over the past day or so with some mild pain in the back. He otherwise has no pain. He has had 2 other episodes of hematuria and flank pain with known kidney stones. He does have a urine strainer at home for his urine but states his urine has never been analyzed. He has never seen a urologist.  Denies any dysuria and has been drinking plenty of fluids.  Denies any fevers or chills.   ROS: No unexpected weight loss, fever, chills, swelling, instability, muscle pain, numbness/tingling, redness, otherwise see HPI   PMHx - Updated and reviewed.  Contributory factors include: Nephrolithiasis, Type I DM PSHx - Updated and reviewed.  Contributory factors include:  Negative FHx - Updated and reviewed.  Contributory factors include:  Negative Social Hx - Updated and reviewed. Contributory factors include: Negative Medications - reviewed, has insulin pump   DATA REVIEWED: Previous office visits, CT A/P, labs  PHYSICAL EXAM:  VS: BP:122/84  HR: bpm  TEMP: ( )  RESP:   HT:5\' 11"  (180.3 cm)   WT:200 lb (90.7 kg)  BMI:28 PHYSICAL EXAM: Gen: NAD, alert, cooperative with exam, well-appearing HEENT: clear conjunctiva,  CV:  no edema, capillary refill brisk, normal rate Resp: non-labored Abd: Bowel sounds are present. No tenderness to palpation and no masses. GU: No CVA tenderness, did have some discomfort on the right though. Skin: no rashes, normal turgor  Neuro: no gross deficits.  Psych:  alert and oriented   ASSESSMENT & PLAN:   Hematuria, gross Likely secondary to nephrolithiasis as he does have a history of this. Will get a UA to check for blood and BMP to check his kidneys.  Asked patient to  strain his urine and bring in any stones that he passes. Gave Mobic as needed for pain.  Did refer to urology as he does have a history of these recurring. Ordered a CT abdomen and pelvis to check to see if he has a stone and/or obstruction. Gave red flag signs for returning to care such as severe pain, fevers, chills.

## 2015-11-03 ENCOUNTER — Other Ambulatory Visit: Payer: 59

## 2015-11-08 ENCOUNTER — Ambulatory Visit
Admission: RE | Admit: 2015-11-08 | Discharge: 2015-11-08 | Disposition: A | Discharge status: Home / Self Care | Payer: 59 | Source: Ambulatory Visit | Attending: Student | Admitting: Student

## 2015-11-08 DIAGNOSIS — R109 Unspecified abdominal pain: Secondary | ICD-10-CM

## 2015-12-03 ENCOUNTER — Other Ambulatory Visit: Payer: Self-pay | Admitting: Urology

## 2015-12-08 ENCOUNTER — Encounter (HOSPITAL_COMMUNITY): Payer: Self-pay

## 2015-12-08 NOTE — Progress Notes (Signed)
Message left for Lossie FaesPam Gibson RE: inability to reach patient

## 2015-12-09 ENCOUNTER — Encounter (HOSPITAL_COMMUNITY): Payer: Self-pay | Admitting: General Practice

## 2015-12-09 ENCOUNTER — Encounter (HOSPITAL_COMMUNITY)
Admission: RE | Disposition: A | Discharge status: Home / Self Care | Payer: Self-pay | Source: Ambulatory Visit | Attending: Urology

## 2015-12-09 ENCOUNTER — Ambulatory Visit (HOSPITAL_COMMUNITY)
Admission: RE | Admit: 2015-12-09 | Discharge: 2015-12-09 | Disposition: A | Discharge status: Home / Self Care | Payer: 59 | Source: Ambulatory Visit | Attending: Urology | Admitting: Urology

## 2015-12-09 ENCOUNTER — Ambulatory Visit (HOSPITAL_COMMUNITY): Payer: 59

## 2015-12-09 DIAGNOSIS — Z841 Family history of disorders of kidney and ureter: Secondary | ICD-10-CM | POA: Diagnosis not present

## 2015-12-09 DIAGNOSIS — E109 Type 1 diabetes mellitus without complications: Secondary | ICD-10-CM | POA: Insufficient documentation

## 2015-12-09 DIAGNOSIS — N200 Calculus of kidney: Secondary | ICD-10-CM | POA: Diagnosis not present

## 2015-12-09 DIAGNOSIS — N201 Calculus of ureter: Secondary | ICD-10-CM

## 2015-12-09 DIAGNOSIS — Z794 Long term (current) use of insulin: Secondary | ICD-10-CM | POA: Diagnosis not present

## 2015-12-09 HISTORY — DX: Calculus of kidney: N20.0

## 2015-12-09 LAB — GLUCOSE, CAPILLARY
Glucose-Capillary: 133 mg/dL — ABNORMAL HIGH (ref 65–99)
Glucose-Capillary: 229 mg/dL — ABNORMAL HIGH (ref 65–99)

## 2015-12-09 SURGERY — LITHOTRIPSY, ESWL
Anesthesia: LOCAL | Laterality: Right

## 2015-12-09 MED ORDER — DIPHENHYDRAMINE HCL 25 MG PO CAPS
25.0000 mg | ORAL_CAPSULE | ORAL | Status: AC
  Administered 2015-12-09: 25 mg via ORAL
  Filled 2015-12-09: qty 1

## 2015-12-09 MED ORDER — CIPROFLOXACIN HCL 500 MG PO TABS
500.0000 mg | ORAL_TABLET | ORAL | Status: AC
  Administered 2015-12-09: 500 mg via ORAL
  Filled 2015-12-09: qty 1

## 2015-12-09 MED ORDER — DIAZEPAM 5 MG PO TABS
10.0000 mg | ORAL_TABLET | ORAL | Status: AC
  Administered 2015-12-09: 10 mg via ORAL
  Filled 2015-12-09: qty 2

## 2015-12-09 MED ORDER — SODIUM CHLORIDE 0.9 % IV SOLN
INTRAVENOUS | Status: DC
  Administered 2015-12-09: 10:00:00 via INTRAVENOUS

## 2015-12-09 NOTE — H&P (Signed)
}   Office Visit Report 11/30/2015    Jared Page         MRN: [id number]  PRIMARY CARE:  Particia Jasper, MD  DOB: 05/17/1995, 20 year old Male  REFERRING:  Jared Page  SSN: [id number]  PROVIDER:  Ihor Page, M.D.    LOCATION:  [facility] [phone]    CC: I have kidney stones.  HPI: Jared Page is a 20 year-old male patient who was referred by Jared Page who is here for renal calculi.  The problem is on the right side. He first stated noticing pain on approximately 09/14/2015. This is not his first kidney stone. His first stone was approximately 11/14/2010. He has had 2 stones prior to getting this one. He is currently having flank pain. He denies having back pain, groin pain, nausea, vomiting, fever, and chills. He has not caught a stone in his urine strainer since his symptoms began.   He has never had surgical treatment for calculi in the past.   CT scan done 07/25/15 revealed a 9 mm stone located at the UPJ on the right-hand side. It had Hounsfield units of 800. No other renal calculi were noted.   He has seen some hematuria intermittently especially after activity. He also has had some intermittent right flank pain but has not been severe. No change in his voiding pattern is been noted.  His stone would be considered of moderate severity with no modifying factors or associated symptoms.     ALLERGIES: No Allergies    MEDICATIONS: HumaLOG SOLN Subcutaneous     GU PSH: None     PSH Notes: Oral Surgery Tooth Extraction   NON-GU PSH: Dental Surgery Procedure - 2013        GU PMH: Calculus Ureter (Acute), Right, Calculus of ureter - 2014 Kidney Stone (Acute), Right, 9 mm located in the right renal pelvis      PMH Notes:  1898-02-13 00:00:00 - Note: Normal Routine History And Physical Adolescent (12 - 11)   NON-GU PMH: Type 1 diabetes mellitus without complications, Type 1 Diabetes Mellitus - 2014 Diabetes Type 2    FAMILY HISTORY: Family Health Status -  Father's Age - Father Family Health Status - Mother's Age - Mother nephrolithiasis - Runs In Family   SOCIAL HISTORY: Marital Status: Single Current Smoking Status: Patient has never smoked.  Social Drinker.  Drinks 1 caffeinated drink per day.     Notes: Occupation:, Caffeine Use, Marital History - Single, Alcohol Use, Never A Smoker   REVIEW OF SYSTEMS:     GU Review Male:  Patient reports get up at night to urinate. Patient denies frequent urination, hard to postpone urination, burning/ pain with urination, leakage of urine, stream starts and stops, trouble starting your stream, have to strain to urinate , erection problems, and penile pain.    Gastrointestinal (Upper):  Patient denies nausea, vomiting, and indigestion/ heartburn.    Gastrointestinal (Lower):  Patient denies diarrhea and constipation.    Constitutional:  Patient denies fever, night sweats, weight loss, and fatigue.    Skin:  Patient denies skin rash/ lesion and itching.    Eyes:  Patient denies blurred vision and double vision.    Ears/ Nose/ Throat:  Patient denies sore throat and sinus problems.    Hematologic/Lymphatic:  Patient denies easy bruising and swollen glands.    Cardiovascular:  Patient denies leg swelling and chest pains.    Respiratory:  Patient denies cough and  shortness of breath.    Endocrine:  Patient denies excessive thirst.    Musculoskeletal:  Patient denies back pain and joint pain.    Neurological:  Patient denies headaches and dizziness.    Psychologic:  Patient denies depression and anxiety.    VITAL SIGNS:       11/30/2015 08:33 AM     Weight 200 lb / 90.72 kg     Height 72 in / 182.88 cm     BP 125/73 mmHg     Pulse 67 /min     BMI 27.1 kg/m     MULTI-SYSTEM PHYSICAL EXAMINATION:      Constitutional: Well-nourished. No physical deformities. Normally developed. Good grooming.     Neck: Neck symmetrical, not swollen. Normal tracheal position.     Respiratory: No labored breathing, no  use of accessory muscles.      Cardiovascular: Normal temperature, normal extremity pulses, no swelling, no varicosities.     Lymphatic: No enlargement of neck, axillae, groin.     Skin: No paleness, no jaundice, no cyanosis. No lesion, no ulcer, no rash.     Neurologic / Psychiatric: Oriented to time, oriented to place, oriented to person. No depression, no anxiety, no agitation.     Gastrointestinal: No mass, no tenderness, no rigidity, non obese abdomen.     Eyes: Normal conjunctivae. Normal eyelids.     Ears, Nose, Mouth, and Throat: Left ear no scars, no lesions, no masses. Right ear no scars, no lesions, no masses. Nose no scars, no lesions, no masses. Normal hearing. Normal lips.     Musculoskeletal: Normal gait and station of head and neck.            PAST DATA REVIEWED:   Source Of History:  Patient  Records Review:  Previous Hospital Records, POC Tool  X-Ray Review: C.Jared. Stone Protocol: Reviewed Films. Reviewed Report. Discussed With Patient.     PROCEDURES:    KUB - 74000  A single view of the abdomen is obtained.      His stone is easily visible at the right UPJ.    Urinalysis w/Scope - 81001  Dipstick Dipstick Cont'd Micro  Specimen: Voided Bilirubin: Neg WBC/hpf: NS (Not Seen)  Color: Yellow Ketones: Neg RBC/hpf: 3-10/hpf  Appearance: Clear Blood: Trace Lysed Bacteria: NS (Not Seen)  Specific Gravity: 1.025 Protein: Neg Cystals: NS (Not Seen)  pH: 6.0 Urobilinogen: 0.2 Casts: NS (Not Seen)  Glucose: 3+ Nitrites: Neg Trichomonas: Not Present   Leukocyte Esterase: Neg Mucous: Not Present    Epithelial Cells: NS (Not Seen)    Yeast: NS (Not Seen)    Sperm: Not Present    ASSESSMENT:     ICD-10 Details  1 GU:  Kidney Stone - N20.0 His stone is visible on KUB. It was marked. He has elected to proceed with lithotripsy.   Notes:  We discussed the management of urinary stones. These options include observation, ureteroscopy, shockwave lithotripsy, and PCNL. We  discussed which options are relevant to these particular stones. We discussed the natural history of stones as well as the complications of untreated stones and the impact on quality of life without treatment as well as with each of the above listed treatments. We also discussed the efficacy of each treatment in its ability to clear the stone burden. With any of these management options I discussed the signs and symptoms of infection and the need for emergent treatment should these be experienced. For each option we discussed the ability  of each procedure to clear the patient of their stone burden.   For observation I described the risks which include but are not limited to silent renal damage, life-threatening infection, need for emergent surgery, failure to pass stone, and pain.   For ureteroscopy I described the risks which include heart attack, stroke, pulmonary embolus, death, bleeding, infection, damage to contiguous structures, positioning injury, ureteral stricture, ureteral avulsion, ureteral injury, need for ureteral stent, inability to perform ureteroscopy, need for an interval procedure, inability to clear stone burden, stent discomfort and pain.   For shockwave lithotripsy I described the risks which include arrhythmia, kidney contusion, kidney hemorrhage, need for transfusion, long-term risk of diabetes or hypertension, back discomfort, flank ecchymosis, flank abrasion, inability to break up stone, inability to pass stone fragments, Steinstrasse, infection associated with obstructing stones, need for different surgical procedure and possible need for repeat shockwave lithotripsy.      PLAN:   Orders  X-Rays: KUB  Schedule  Return Visit: Next Available Appointment - Schedule Surgery  Document  Letter(s):  Created for Jared Frazier Buttyan Draper   Created for Patient: Clinical Summary   Signed by Jared GullyMark Ottelin, M.D. on 11/30/15 at 9:28 AM (EDT)   The information contained in this medical record  document is considered private and confidential patient information. This information can only be used for the medical diagnosis and/or medical services that are being provided by the patient's selected caregivers. This information can only be distributed outside of the patient's care if the patient agrees and signs waivers of authorization for this information to be sent to an outside source or route.

## 2015-12-09 NOTE — Discharge Instructions (Signed)
Dietary Guidelines to Help Prevent Kidney Stones °Your risk of kidney stones can be decreased by adjusting the foods you eat. The most important thing you can do is drink enough fluid. You should drink enough fluid to keep your urine clear or pale yellow. The following guidelines provide specific information for the type of kidney stone you have had. °GUIDELINES ACCORDING TO TYPE OF KIDNEY STONE °Calcium Oxalate Kidney Stones °· Reduce the amount of salt you eat. Foods that have a lot of salt cause your body to release excess calcium into your urine. The excess calcium can combine with a substance called oxalate to form kidney stones. °· Reduce the amount of animal protein you eat if the amount you eat is excessive. Animal protein causes your body to release excess calcium into your urine. Ask your dietitian how much protein from animal sources you should be eating. °· Avoid foods that are high in oxalates. If you take vitamins, they should have less than 500 mg of vitamin C. Your body turns vitamin C into oxalates. You do not need to avoid fruits and vegetables high in vitamin C. °Calcium Phosphate Kidney Stones °· Reduce the amount of salt you eat to help prevent the release of excess calcium into your urine. °· Reduce the amount of animal protein you eat if the amount you eat is excessive. Animal protein causes your body to release excess calcium into your urine. Ask your dietitian how much protein from animal sources you should be eating. °· Get enough calcium from food or take a calcium supplement (ask your dietitian for recommendations). Food sources of calcium that do not increase your risk of kidney stones include: °¨ Broccoli. °¨ Dairy products, such as cheese and yogurt. °¨ Pudding. °Uric Acid Kidney Stones °· Do not have more than 6 oz of animal protein per day. °FOOD SOURCES °Animal Protein Sources °· Meat (all types). °· Poultry. °· Eggs. °· Fish, seafood. °Foods High in Salt °· Salt seasonings. °· Soy  sauce. °· Teriyaki sauce. °· Cured and processed meats. °· Salted crackers and snack foods. °· Fast food. °· Canned soups and most canned foods. °Foods High in Oxalates °· Grains: °¨ Amaranth. °¨ Barley. °¨ Grits. °¨ Wheat germ. °¨ Bran. °¨ Buckwheat flour. °¨ All bran cereals. °¨ Pretzels. °¨ Whole wheat bread. °· Vegetables: °¨ Beans (wax). °¨ Beets and beet greens. °¨ Collard greens. °¨ Eggplant. °¨ Escarole. °¨ Leeks. °¨ Okra. °¨ Parsley. °¨ Rutabagas. °¨ Spinach. °¨ Swiss chard. °¨ Tomato paste. °¨ Fried potatoes. °¨ Sweet potatoes. °· Fruits: °¨ Red currants. °¨ Figs. °¨ Kiwi. °¨ Rhubarb. °· Meat and Other Protein Sources: °¨ Beans (dried). °¨ Soy burgers and other soybean products. °¨ Miso. °¨ Nuts (peanuts, almonds, pecans, cashews, hazelnuts). °¨ Nut butters. °¨ Sesame seeds and tahini (paste made of sesame seeds). °¨ Poppy seeds. °· Beverages: °¨ Chocolate drink mixes. °¨ Soy milk. °¨ Instant iced tea. °¨ Juices made from high-oxalate fruits or vegetables. °· Other: °¨ Carob. °¨ Chocolate. °¨ Fruitcake. °¨ Marmalades. °  °This information is not intended to replace advice given to you by your health care provider. Make sure you discuss any questions you have with your health care provider. °  °Document Released: 05/27/2010 Document Revised: 02/04/2013 Document Reviewed: 12/27/2012 °Elsevier Interactive Patient Education ©2016 Elsevier Inc. ° ° °Kidney Stones °Kidney stones (urolithiasis) are deposits that form inside your kidneys. The intense pain is caused by the stone moving through the urinary tract. When the stone moves, the   ureter goes into spasm around the stone. The stone is usually passed in the urine.  °CAUSES  °· A disorder that makes certain neck glands produce too much parathyroid hormone (primary hyperparathyroidism). °· A buildup of uric acid crystals, similar to gout in your joints. °· Narrowing (stricture) of the ureter. °· A kidney obstruction present at birth (congenital  obstruction). °· Previous surgery on the kidney or ureters. °· Numerous kidney infections. °SYMPTOMS  °· Feeling sick to your stomach (nauseous). °· Throwing up (vomiting). °· Blood in the urine (hematuria). °· Pain that usually spreads (radiates) to the groin. °· Frequency or urgency of urination. °DIAGNOSIS  °· Taking a history and physical exam. °· Blood or urine tests. °· CT scan. °· Occasionally, an examination of the inside of the urinary bladder (cystoscopy) is performed. °TREATMENT  °· Observation. °· Increasing your fluid intake. °· Extracorporeal shock wave lithotripsy--This is a noninvasive procedure that uses shock waves to break up kidney stones. °· Surgery may be needed if you have severe pain or persistent obstruction. There are various surgical procedures. Most of the procedures are performed with the use of small instruments. Only small incisions are needed to accommodate these instruments, so recovery time is minimized. °The size, location, and chemical composition are all important variables that will determine the proper choice of action for you. Talk to your health care provider to better understand your situation so that you will minimize the risk of injury to yourself and your kidney.  °HOME CARE INSTRUCTIONS  °· Drink enough water and fluids to keep your urine clear or pale yellow. This will help you to pass the stone or stone fragments. °· Strain all urine through the provided strainer. Keep all particulate matter and stones for your health care provider to see. The stone causing the pain may be as small as a grain of salt. It is very important to use the strainer each and every time you pass your urine. The collection of your stone will allow your health care provider to analyze it and verify that a stone has actually passed. The stone analysis will often identify what you can do to reduce the incidence of recurrences. °· Only take over-the-counter or prescription medicines for pain,  discomfort, or fever as directed by your health care provider. °· Keep all follow-up visits as told by your health care provider. This is important. °· Get follow-up X-rays if required. The absence of pain does not always mean that the stone has passed. It may have only stopped moving. If the urine remains completely obstructed, it can cause loss of kidney function or even complete destruction of the kidney. It is your responsibility to make sure X-rays and follow-ups are completed. Ultrasounds of the kidney can show blockages and the status of the kidney. Ultrasounds are not associated with any radiation and can be performed easily in a matter of minutes. °· Make changes to your daily diet as told by your health care provider. You may be told to: °¨ Limit the amount of salt that you eat. °¨ Eat 5 or more servings of fruits and vegetables each day. °¨ Limit the amount of meat, poultry, fish, and eggs that you eat. °· Collect a 24-hour urine sample as told by your health care provider. You may need to collect another urine sample every 6-12 months. °SEEK MEDICAL CARE IF: °· You experience pain that is progressive and unresponsive to any pain medicine you have been prescribed. °SEEK IMMEDIATE MEDICAL CARE IF:  °·   Pain cannot be controlled with the prescribed medicine. °· You have a fever or shaking chills. °· The severity or intensity of pain increases over 18 hours and is not relieved by pain medicine. °· You develop a new onset of abdominal pain. °· You feel faint or pass out. °· You are unable to urinate. °  °This information is not intended to replace advice given to you by your health care provider. Make sure you discuss any questions you have with your health care provider. °  °Document Released: 01/30/2005 Document Revised: 10/21/2014 Document Reviewed: 07/03/2012 °Elsevier Interactive Patient Education ©2016 Elsevier Inc. ° °

## 2015-12-09 NOTE — Interval H&P Note (Signed)
History and Physical Interval Note:  12/09/2015 11:29 AM  Jared Page  has presented today for surgery, with the diagnosis of RIGHT URETERAL PELVIC JUNCTION STONE  The various methods of treatment have been discussed with the patient and family. After consideration of risks, benefits and other options for treatment, the patient has consented to  Procedure(s): RIGHT EXTRACORPOREAL SHOCK WAVE LITHOTRIPSY (ESWL) (Right) as a surgical intervention .  The patient's history has been reviewed, patient examined, no change in status, stable for surgery.  I have reviewed the patient's chart and labs.  Questions were answered to the patient's satisfaction.     Anacleto Batterman I [name]

## 2016-04-13 DIAGNOSIS — Z794 Long term (current) use of insulin: Secondary | ICD-10-CM | POA: Diagnosis not present

## 2016-04-13 DIAGNOSIS — E1065 Type 1 diabetes mellitus with hyperglycemia: Secondary | ICD-10-CM | POA: Diagnosis not present

## 2016-05-08 DIAGNOSIS — E109 Type 1 diabetes mellitus without complications: Secondary | ICD-10-CM | POA: Diagnosis not present

## 2016-05-09 DIAGNOSIS — E109 Type 1 diabetes mellitus without complications: Secondary | ICD-10-CM | POA: Diagnosis not present

## 2016-07-28 DIAGNOSIS — Z794 Long term (current) use of insulin: Secondary | ICD-10-CM | POA: Diagnosis not present

## 2016-07-28 DIAGNOSIS — E1065 Type 1 diabetes mellitus with hyperglycemia: Secondary | ICD-10-CM | POA: Diagnosis not present

## 2016-08-22 DIAGNOSIS — E109 Type 1 diabetes mellitus without complications: Secondary | ICD-10-CM | POA: Diagnosis not present

## 2016-09-13 DIAGNOSIS — E109 Type 1 diabetes mellitus without complications: Secondary | ICD-10-CM | POA: Diagnosis not present

## 2016-09-13 DIAGNOSIS — Z794 Long term (current) use of insulin: Secondary | ICD-10-CM | POA: Diagnosis not present

## 2016-09-13 DIAGNOSIS — L03115 Cellulitis of right lower limb: Secondary | ICD-10-CM | POA: Diagnosis not present

## 2016-09-15 ENCOUNTER — Ambulatory Visit (INDEPENDENT_AMBULATORY_CARE_PROVIDER_SITE_OTHER): Payer: 59 | Admitting: Physician Assistant

## 2016-09-15 ENCOUNTER — Encounter: Payer: Self-pay | Admitting: Physician Assistant

## 2016-09-15 VITALS — BP 130/88 | HR 86 | Temp 97.6°F | Resp 18 | Ht 71.25 in | Wt 201.8 lb

## 2016-09-15 DIAGNOSIS — L02415 Cutaneous abscess of right lower limb: Secondary | ICD-10-CM

## 2016-09-15 DIAGNOSIS — E109 Type 1 diabetes mellitus without complications: Secondary | ICD-10-CM | POA: Diagnosis not present

## 2016-09-15 MED ORDER — SULFAMETHOXAZOLE-TRIMETHOPRIM 800-160 MG PO TABS: 1.0000 | ORAL_TABLET | Freq: Two times a day (BID) | ORAL | 0 refills | Status: AC

## 2016-09-15 NOTE — Patient Instructions (Addendum)
Continue Doxy and start the next abx  Warm compresses  Ok for tylenol and or motrin for pain.    IF you received an x-ray today, you will receive an invoice [facility]. Please contact [facility] at [phone] with questions or concerns regarding your invoice.   IF you received labwork today, you will receive an invoice from [facility]. Please contact [facility] at [phone] with questions or concerns regarding your invoice.   Our billing staff will not be able to assist you with questions regarding bills from these companies.  You will be contacted with the lab results as soon as they are available. The fastest way to get your results is to activate your My Chart account. Instructions are located on the last page of this paperwork. If you have not heard from us regarding the results in 2 weeks, please contact this office.

## 2016-09-15 NOTE — Progress Notes (Signed)
Chief Complaint  Patient presents with  . Wound Infection    R upper leg infection got really bad on Monday. Met with an online doctor Wednesday at 5p and was put on ABT   . Emesis    x 2 this morning around 10.    HPI ROS  Admits: Vomiting and nausea this morning (attributes to antibiotic. Takes antibiotic without food) Denies Fever, chills, constipation, diarrhea, numbness or tinging of limbs  Patient Active Problem List   Diagnosis Date Noted  . Hematuria, gross 10/28/2015  . Nephrolithiasis 10/28/2015  . Vitamin D insufficiency 10/02/2012  . Controlled type 1 diabetes mellitus ([place]) 06/24/2006   Prior to Admission medications   Medication Sig Start Date End Date Taking? Authorizing Provider  cetirizine (ZYRTEC) 10 MG tablet Take 10 mg by mouth daily.   Yes [provider]  doxycycline (VIBRAMYCIN) 100 MG capsule Take 100 mg by mouth 2 (two) times daily.   Yes [provider]  GLUCAGON EMERGENCY 1 MG injection  09/29/15  Yes [provider]  glucose blood (ONE TOUCH ULTRA TEST) test strip  12/21/14  Yes [provider]  HUMALOG 100 UNIT/ML injection 90 Units.  09/02/15  Yes [provider]  ibuprofen (ADVIL,MOTRIN) 600 MG tablet Take 1 tablet (600 mg total) by mouth 3 (three) times daily. 02/15/15  Yes Mesner, Corene Cornea, MD  insulin glargine (LANTUS) 100 UNIT/ML injection Use as directed currently on 47 units daily in case of pump failure 07/10/15  Yes [provider]  meloxicam (MOBIC) 15 MG tablet Take 1 tablet (15 mg total) by mouth daily. Patient not taking: Reported on 09/15/2016 9/34/06   Chitanand, Elmo Putt B, DO   No Known Allergies  Vital signs BP (!) 138/91   Pulse 86   Temp 97.6 F (36.4 C) (Oral)   Resp 18   Ht 5' 11.25" (1.81 m)   Wt 201 lb 12.8 oz (91.5 kg)   SpO2 98%   BMI 27.95 kg/m   Physical Exam  Skin:  15cmx9cm erythematous circle with a 1cmx2cm indurated nodule with two central crustings at the 12  o'clock position located on an eccymotic base in the right groin. The erythematous circle spread inferiorly, with no lateral or superior progression.   Assessment and Plan 1. Abscess of right thigh Continue to take doxycyline while you start this new antibiotic. Avoid dairy products 2 hours before and 2 hours after taking the doxycyline. - sulfamethoxazole-trimethoprim (BACTRIM DS,SEPTRA DS) 800-160 MG tablet; Take 1 tablet by mouth 2 (two) times daily.  Dispense: 20 tablet; Refill: 0  2. Controlled diabetes mellitus type 1 without complications [facility])  Jared Yearwood "Mia" Kihanna Kamiya, PA-[facility] 09/15/16 6:42 PM

## 2016-09-16 NOTE — Progress Notes (Signed)
Jared Page  MRN: [id number] DOB: August 17, 1995  PCP: Jared Page, No Pcp Per  Chief Complaint  Jared Page presents with  . Wound Infection    R upper leg infection got really bad on Monday. Met with an online doctor Wednesday at 5p and was put on ABT   . Emesis    x 2 this morning around 10.    Subjective:  Pt presents to clinic for concerns that he has an infection in his right groin.  It started 6 days ago as a small bump with small amount of surrounding erythema that got worse.  5 days ago he had a pus bump that he was able to put and get pus out of it and he pushes really hard around that area.  Then he noticed the 2 following days that the erythema had worsened.  He has the ability with his insurance for telemedicine and he was started on Doxy 2 days ago and instructed to mark the surrounding erythema and the erythema has gone outside of the area.  He has had no F/C.  He did have some nausea and vomiting this am after he took the Doxy on an empty stomach.   He has never shaved in this area before, but he tried to shave over the area with his electric razor, and it did not go away. He has tried warm compresses 3 times a day for 5 minutes.   He reports he noticed a change in his blood sugar levels and needed more insulin. Today his blood sugar was 107.  History is obtained by Jared Page.  Review of Systems  Constitutional: Negative for chills and fever.  Gastrointestinal: Positive for nausea.  Skin: Positive for wound.    Jared Page Active Problem List   Diagnosis Date Noted  . Hematuria, gross 10/28/2015  . Nephrolithiasis 10/28/2015  . Vitamin D insufficiency 10/02/2012  . Controlled type 1 diabetes mellitus ([place]) 06/24/2006    Current Outpatient Prescriptions on File Prior to Visit  Medication Sig Dispense Refill  . cetirizine (ZYRTEC) 10 MG tablet Take 10 mg by mouth daily.    Marland Kitchen GLUCAGON EMERGENCY 1 MG injection     . glucose blood (ONE TOUCH ULTRA TEST) test strip     . HUMALOG  100 UNIT/ML injection 90 Units.     Marland Kitchen ibuprofen (ADVIL,MOTRIN) 600 MG tablet Take 1 tablet (600 mg total) by mouth 3 (three) times daily. 30 tablet 0  . insulin glargine (LANTUS) 100 UNIT/ML injection Use as directed currently on 47 units daily in case of pump failure    . meloxicam (MOBIC) 15 MG tablet Take 1 tablet (15 mg total) by mouth daily. (Jared Page not taking: Reported on 09/15/2016) 30 tablet 2   No current facility-administered medications on file prior to visit.     No Known Allergies  Past Medical History:  Diagnosis Date  . Diabetes mellitus   . Kidney stones    Social History   Social History Narrative  . No narrative on file   Social History  Substance Use Topics  . Smoking status: Never Smoker  . Smokeless tobacco: Never Used  . Alcohol use 0.0 oz/week     Comment: 1 mixed  drink a week   family history includes Diabetes in his brother and mother; Heart disease in his mother; Hyperlipidemia in his mother; Stroke in his maternal grandfather.     Objective:  BP 130/88   Pulse 86   Temp 97.6 F (36.4 C) (Oral)  Resp 18   Ht 5' 11.25" (1.81 m)   Wt 201 lb 12.8 oz (91.5 kg)   SpO2 98%   BMI 27.95 kg/m  Body mass index is 27.95 kg/m.  Physical Exam  Constitutional: He is oriented to person, place, and time and well-developed, well-nourished, and in no distress.  HENT:  Head: Normocephalic and atraumatic.  Right Ear: External ear normal.  Left Ear: External ear normal.  Eyes: Conjunctivae are normal.  Neck: Normal range of motion.  Cardiovascular: Normal rate, regular rhythm and normal heart sounds.   No murmur heard. Pulmonary/Chest: Effort normal and breath sounds normal. He has no wheezes.  Lymphadenopathy:       Right: No inguinal adenopathy present.  Neurological: He is alert and oriented to person, place, and time. Gait normal.  Skin: Skin is warm and dry.  Right groin area - central ecchymosis with indurated area with scabbed - mild erythema  distal to the indurated scabbed area about 10cm - no warmth - the distal erythema is outside of the marked area from 2 days ago by 2-3 cm  Psychiatric: Mood, memory, affect and judgment normal.   Reviewed photos provided by the Jared Page with the wound in different stages.  It appears less erythema and only slightly larger in the distal area of the wound  Assessment and Plan :  Abscess of right thigh - Plan: sulfamethoxazole-trimethoprim (BACTRIM DS,SEPTRA DS) 800-160 MG tablet  Controlled diabetes mellitus type 1 without complications ([place]) continue to monitor his glucose and adjust his sliding scale insulin.  Continue doxy - he is traveling this weekend and due to slight enlarging area of erythema will add septra - on I&D is indicated at this time - continue warm compresses - the ecchymosis is from his compression when he tried to open it 4 days ago.  Windell Hummingbird PA-C  [facility] 09/20/2016 7:04 PM

## 2016-12-04 DIAGNOSIS — Z794 Long term (current) use of insulin: Secondary | ICD-10-CM | POA: Diagnosis not present

## 2016-12-04 DIAGNOSIS — E1065 Type 1 diabetes mellitus with hyperglycemia: Secondary | ICD-10-CM | POA: Diagnosis not present

## 2016-12-05 DIAGNOSIS — E109 Type 1 diabetes mellitus without complications: Secondary | ICD-10-CM | POA: Diagnosis not present

## 2016-12-27 DIAGNOSIS — Z23 Encounter for immunization: Secondary | ICD-10-CM | POA: Diagnosis not present

## 2017-05-02 DIAGNOSIS — E109 Type 1 diabetes mellitus without complications: Secondary | ICD-10-CM | POA: Diagnosis not present

## 2017-05-25 DIAGNOSIS — E109 Type 1 diabetes mellitus without complications: Secondary | ICD-10-CM | POA: Diagnosis not present

## 2017-06-06 DIAGNOSIS — E1065 Type 1 diabetes mellitus with hyperglycemia: Secondary | ICD-10-CM | POA: Diagnosis not present

## 2017-06-06 DIAGNOSIS — Z794 Long term (current) use of insulin: Secondary | ICD-10-CM | POA: Diagnosis not present

## 2017-06-29 ENCOUNTER — Encounter: Payer: Self-pay | Admitting: Family Medicine

## 2017-07-04 ENCOUNTER — Encounter: Payer: Self-pay | Admitting: Family Medicine

## 2017-08-17 DIAGNOSIS — E109 Type 1 diabetes mellitus without complications: Secondary | ICD-10-CM | POA: Diagnosis not present

## 2017-09-11 DIAGNOSIS — E1065 Type 1 diabetes mellitus with hyperglycemia: Secondary | ICD-10-CM | POA: Diagnosis not present

## 2017-09-11 DIAGNOSIS — Z794 Long term (current) use of insulin: Secondary | ICD-10-CM | POA: Diagnosis not present

## 2018-02-07 IMAGING — CT CT ABD-PELV W/O CM
1 of 2 series · 4 of 32 positions shown, 9 images · non-contrast
Comparison: August 04, 2015

CLINICAL DATA: History of right flank pain and microhematuria for 3
weeks.

EXAM:
CT ABDOMEN AND PELVIS WITHOUT CONTRAST
TECHNIQUE: Multidetector CT imaging of the abdomen and pelvis was performed
following the standard protocol without IV contrast.

[Series 5: lung · axial · 0.87mm/px · z∈[-130,-60]mm · 4 of 24 slices shown, 9 images]
[im 5/24  soft-tissue]
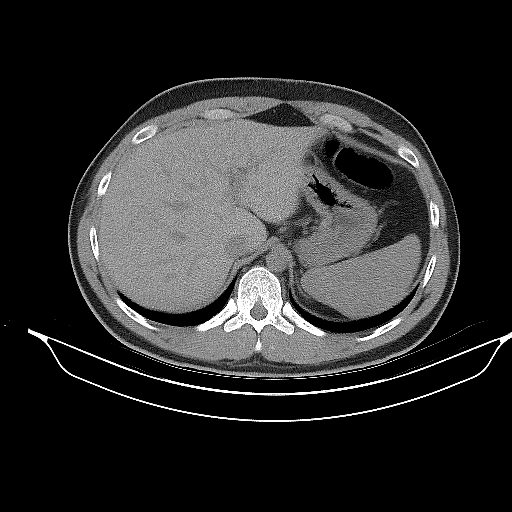
[im 5/24  lung]
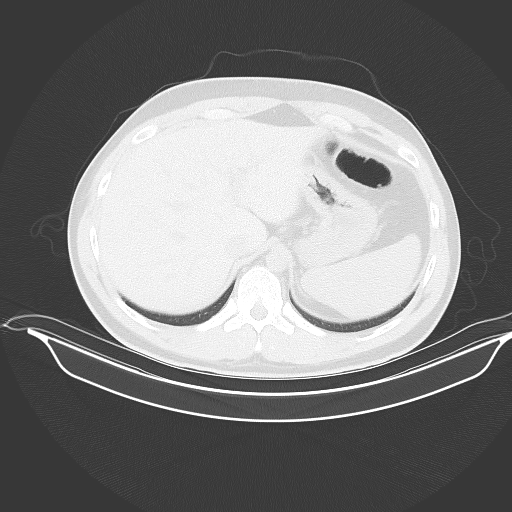
[im 5/24  bone]
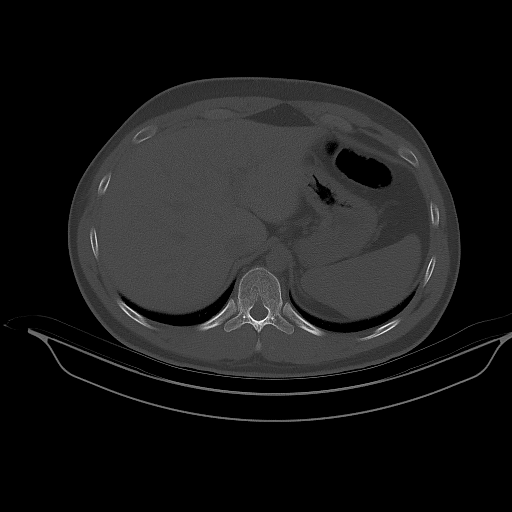
[im 10/24  soft-tissue]
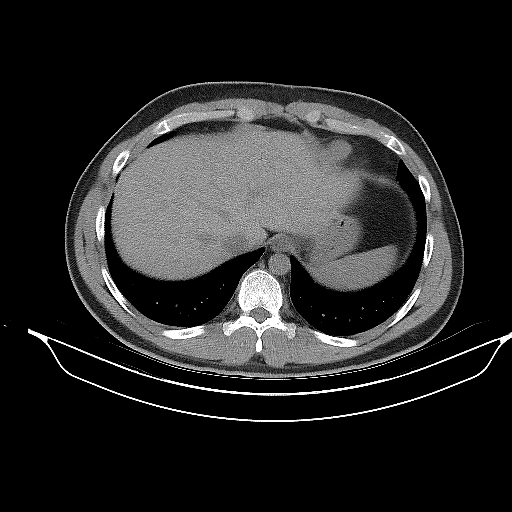
[im 10/24  lung]
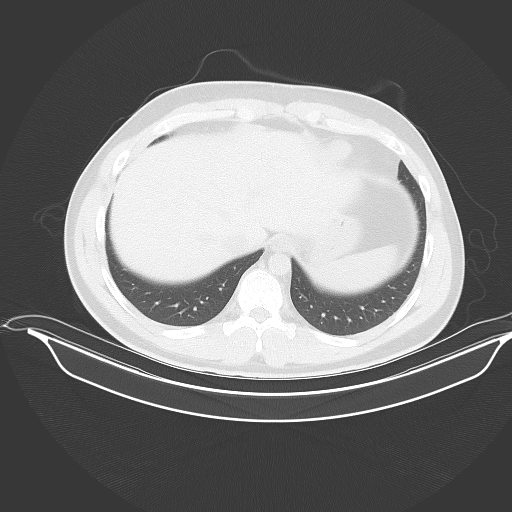
[im 14/24  soft-tissue]
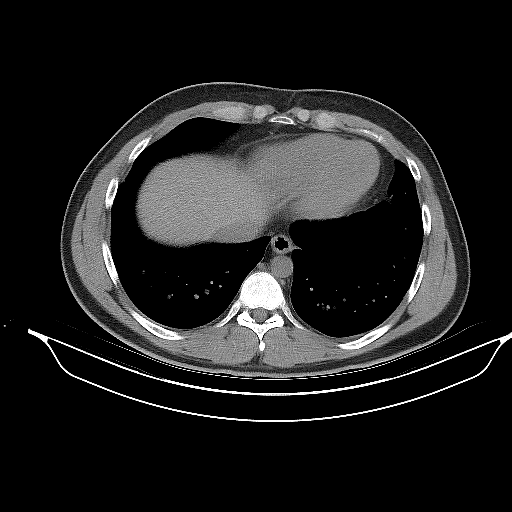
[im 14/24  lung]
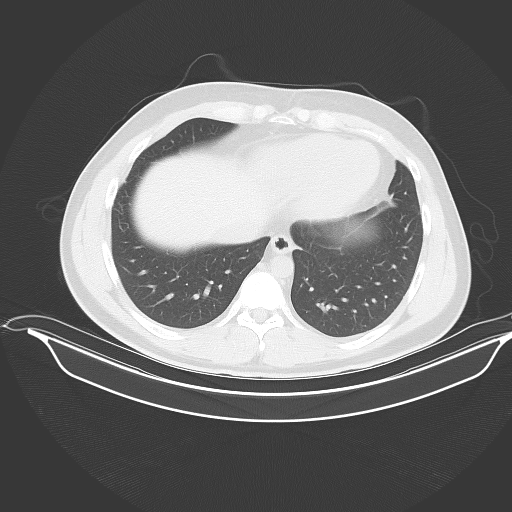
[im 19/24  soft-tissue]
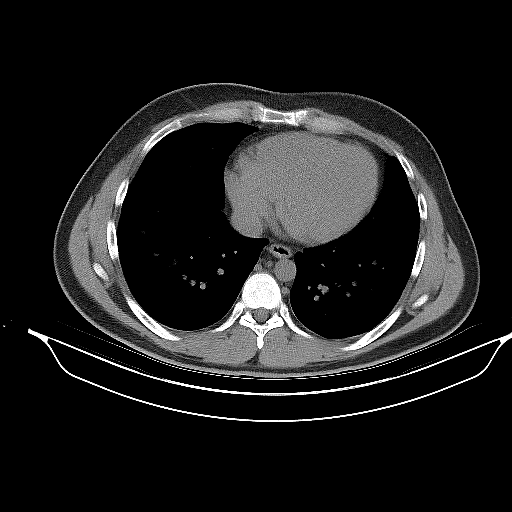
[im 19/24  lung]
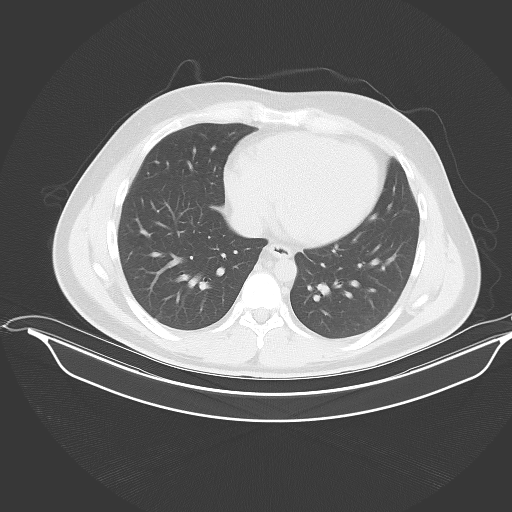

[4 of 32 positions shown; findings below may reference images not displayed]

FINDINGS: Lower chest: No acute abnormality.

Hepatobiliary: No focal liver abnormality is seen. No gallstones,
gallbladder wall thickening, or biliary dilatation.

Pancreas: Unremarkable. No pancreatic ductal dilatation or
surrounding inflammatory changes.

Spleen: Normal in size without focal abnormality.

Adrenals/Urinary Tract: There is a persistent 9 mm stone in the
right renal pelvis unchanged compared prior exam. There is a
question 1 mm stone in proximal to mid right ureter best seen on
image 51. There is probable minimal right hydronephrosis, unchanged.
The right ureter is normal in caliber. There is no left kidney stone
or left hydronephrosis. The adrenal glands are normal. The bladder
is normal.

Stomach/Bowel: There is no small bowel obstruction or
diverticulitis. The appendix is normal. The stomach is normal.

Vascular/Lymphatic: No significant vascular findings are present. No
enlarged abdominal or pelvic lymph nodes.

Reproductive: Prostate is unremarkable.

Other: Left pelvic phlebolith is identified unchanged.

Musculoskeletal: No acute abnormality is identified.
IMPRESSION: 9 mm stone in the right renal pelvis causing minimal right
hydronephrosis unchanged compared to the prior exam. Question 1 mm
stone in the proximal to mid right ureter best seen on image 51.
There is no evidence of obstruction with normal caliber right
ureter.

## 2019-05-01 ENCOUNTER — Ambulatory Visit: Payer: 59 | Attending: Family

## 2019-05-01 DIAGNOSIS — Z23 Encounter for immunization: Secondary | ICD-10-CM

## 2019-05-01 NOTE — Progress Notes (Signed)
   Covid-19 Vaccination Clinic  Name:  Jared Page    MRN: [id number] DOB: 10-06-1995  05/01/2019  Mr. Dreibelbis was observed post Covid-19 immunization for 15 minutes without incident. He was provided with Vaccine Information Sheet and instruction to access the V-Safe system.   Mr. [place] was instructed to call 911 with any severe reactions post vaccine: Marland Kitchen Difficulty breathing  . Swelling of face and throat  . A fast heartbeat  . A bad rash all over body  . Dizziness and weakness   Immunizations Administered    Name Date Dose VIS Date Route   Moderna COVID-19 Vaccine 05/01/2019 10:30 AM 0.5 mL 01/14/2019 Intramuscular   Manufacturer: Moderna   Lot: [id number]   NDC: 51884-166-06

## 2019-06-03 ENCOUNTER — Ambulatory Visit: Payer: 59 | Attending: Family

## 2019-06-03 DIAGNOSIS — Z23 Encounter for immunization: Secondary | ICD-10-CM

## 2019-06-03 NOTE — Progress Notes (Signed)
   Covid-19 Vaccination Clinic  Name:  Giavanni Odonovan    MRN: [id number] DOB: 1995-02-19  06/03/2019  Mr. Arp was observed post Covid-19 immunization for 15 minutes without incident. He was provided with Vaccine Information Sheet and instruction to access the V-Safe system.   Mr. Werth was instructed to call 911 with any severe reactions post vaccine: Marland Kitchen Difficulty breathing  . Swelling of face and throat  . A fast heartbeat  . A bad rash all over body  . Dizziness and weakness   Immunizations Administered    Name Date Dose VIS Date Route   Moderna COVID-19 Vaccine 06/03/2019 10:15 AM 0.5 mL 01/2019 Intramuscular   Manufacturer: Moderna   Lot: [id number]   NDC: 96728-979-15
